# Patient Record
Sex: Male | Born: 1995 | Race: White | Hispanic: No | Marital: Single | Smoking: Never smoker
Health system: Southern US, Community
[De-identification: ages and names within clinical notes are randomized; demographics above are authoritative.]

## PROBLEM LIST (undated history)

## (undated) DIAGNOSIS — R51 Headache: Secondary | ICD-10-CM

## (undated) DIAGNOSIS — R519 Headache, unspecified: Secondary | ICD-10-CM

## (undated) DIAGNOSIS — J45909 Unspecified asthma, uncomplicated: Secondary | ICD-10-CM

## (undated) HISTORY — PX: FINGER SURGERY: SHX640

---
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
## 2017-12-01 ENCOUNTER — Other Ambulatory Visit: Payer: Self-pay | Admitting: Orthopedic Surgery

## 2017-12-01 DIAGNOSIS — M2391 Unspecified internal derangement of right knee: Secondary | ICD-10-CM

## 2017-12-01 DIAGNOSIS — S8391XA Sprain of unspecified site of right knee, initial encounter: Secondary | ICD-10-CM

## 2017-12-10 ENCOUNTER — Ambulatory Visit
Admission: RE | Admit: 2017-12-10 | Discharge: 2017-12-10 | Disposition: A | Discharge status: Home / Self Care | Payer: Commercial Managed Care - HMO | Source: Ambulatory Visit | Attending: Orthopedic Surgery | Admitting: Orthopedic Surgery

## 2017-12-10 DIAGNOSIS — S83241A Other tear of medial meniscus, current injury, right knee, initial encounter: Secondary | ICD-10-CM | POA: Insufficient documentation

## 2017-12-10 DIAGNOSIS — M2391 Unspecified internal derangement of right knee: Secondary | ICD-10-CM

## 2017-12-10 DIAGNOSIS — R600 Localized edema: Secondary | ICD-10-CM | POA: Insufficient documentation

## 2017-12-10 DIAGNOSIS — S8391XA Sprain of unspecified site of right knee, initial encounter: Secondary | ICD-10-CM | POA: Diagnosis present

## 2017-12-10 DIAGNOSIS — X58XXXA Exposure to other specified factors, initial encounter: Secondary | ICD-10-CM | POA: Diagnosis not present

## 2017-12-20 ENCOUNTER — Other Ambulatory Visit: Payer: Self-pay

## 2017-12-20 ENCOUNTER — Encounter: Payer: Self-pay | Admitting: *Deleted

## 2017-12-23 ENCOUNTER — Ambulatory Visit: Payer: Commercial Managed Care - HMO | Admitting: Anesthesiology

## 2017-12-23 ENCOUNTER — Ambulatory Visit
Admission: RE | Admit: 2017-12-23 | Discharge: 2017-12-23 | Disposition: A | Discharge status: Home / Self Care | Payer: Commercial Managed Care - HMO | Source: Ambulatory Visit | Attending: Orthopedic Surgery | Admitting: Orthopedic Surgery

## 2017-12-23 ENCOUNTER — Encounter
Admission: RE | Disposition: A | Discharge status: Home / Self Care | Payer: Self-pay | Source: Ambulatory Visit | Attending: Orthopedic Surgery

## 2017-12-23 DIAGNOSIS — Y9323 Activity, snow (alpine) (downhill) skiing, snow boarding, sledding, tobogganing and snow tubing: Secondary | ICD-10-CM | POA: Insufficient documentation

## 2017-12-23 DIAGNOSIS — S83241A Other tear of medial meniscus, current injury, right knee, initial encounter: Secondary | ICD-10-CM | POA: Insufficient documentation

## 2017-12-23 DIAGNOSIS — X58XXXA Exposure to other specified factors, initial encounter: Secondary | ICD-10-CM | POA: Insufficient documentation

## 2017-12-23 DIAGNOSIS — W000XXA Fall on same level due to ice and snow, initial encounter: Secondary | ICD-10-CM | POA: Diagnosis not present

## 2017-12-23 DIAGNOSIS — M794 Hypertrophy of (infrapatellar) fat pad: Secondary | ICD-10-CM | POA: Diagnosis not present

## 2017-12-23 HISTORY — DX: Unspecified asthma, uncomplicated: J45.909

## 2017-12-23 HISTORY — DX: Headache, unspecified: R51.9

## 2017-12-23 HISTORY — PX: KNEE ARTHROSCOPY WITH MEDIAL MENISECTOMY: SHX5651

## 2017-12-23 HISTORY — DX: Headache: R51

## 2017-12-23 SURGERY — ARTHROSCOPY, KNEE, WITH MEDIAL MENISCECTOMY
Anesthesia: General | Site: Knee | Laterality: Right | Wound class: Clean

## 2017-12-23 MED ORDER — ACETAMINOPHEN 10 MG/ML IV SOLN
1000.0000 mg | Freq: Once | INTRAVENOUS | Status: DC | PRN
  Administered 2017-12-23: 1000 mg via INTRAVENOUS

## 2017-12-23 MED ORDER — CEFAZOLIN SODIUM-DEXTROSE 2-4 GM/100ML-% IV SOLN
2.0000 g | Freq: Once | INTRAVENOUS | Status: AC
  Administered 2017-12-23: 2 g via INTRAVENOUS

## 2017-12-23 MED ORDER — OXYCODONE HCL 5 MG PO TABS: 5.0000 mg | ORAL_TABLET | ORAL | 0 refills | Status: AC | PRN

## 2017-12-23 MED ORDER — IBUPROFEN 800 MG PO TABS: 800.0000 mg | ORAL_TABLET | Freq: Three times a day (TID) | ORAL | 0 refills | Status: AC

## 2017-12-23 MED ORDER — GLYCOPYRROLATE 0.2 MG/ML IJ SOLN
INTRAMUSCULAR | Status: DC | PRN
  Administered 2017-12-23: 0.1 mg via INTRAVENOUS

## 2017-12-23 MED ORDER — ACETAMINOPHEN 500 MG PO TABS: 1000.0000 mg | ORAL_TABLET | Freq: Three times a day (TID) | ORAL | 2 refills | Status: AC

## 2017-12-23 MED ORDER — ONDANSETRON HCL 4 MG/2ML IJ SOLN
INTRAMUSCULAR | Status: DC | PRN
  Administered 2017-12-23: 4 mg via INTRAVENOUS

## 2017-12-23 MED ORDER — ROPIVACAINE HCL 5 MG/ML IJ SOLN
INTRAMUSCULAR | Status: DC | PRN
Start: 2017-12-23 — End: 2017-12-23
  Administered 2017-12-23: 20 mL

## 2017-12-23 MED ORDER — BUPIVACAINE HCL 0.5 % IJ SOLN
INTRAMUSCULAR | Status: DC | PRN
  Administered 2017-12-23: 1.5 mL

## 2017-12-23 MED ORDER — PROPOFOL 10 MG/ML IV BOLUS
INTRAVENOUS | Status: DC | PRN
  Administered 2017-12-23: 160 mg via INTRAVENOUS

## 2017-12-23 MED ORDER — FENTANYL CITRATE (PF) 100 MCG/2ML IJ SOLN
25.0000 ug | INTRAMUSCULAR | Status: DC | PRN
  Administered 2017-12-23: 25 ug via INTRAVENOUS
  Administered 2017-12-23 (×2): 12.5 ug via INTRAVENOUS

## 2017-12-23 MED ORDER — LACTATED RINGERS IV SOLN
INTRAVENOUS | Status: DC
  Administered 2017-12-23: 12:00:00 via INTRAVENOUS

## 2017-12-23 MED ORDER — FENTANYL CITRATE (PF) 100 MCG/2ML IJ SOLN
INTRAMUSCULAR | Status: DC | PRN
  Administered 2017-12-23: 100 ug via INTRAVENOUS
  Administered 2017-12-23: 50 ug via INTRAVENOUS

## 2017-12-23 MED ORDER — DEXAMETHASONE SODIUM PHOSPHATE 4 MG/ML IJ SOLN
INTRAMUSCULAR | Status: DC | PRN
  Administered 2017-12-23: 6 mg via INTRAVENOUS

## 2017-12-23 MED ORDER — MIDAZOLAM HCL 2 MG/2ML IJ SOLN
INTRAMUSCULAR | Status: DC | PRN
Start: 2017-12-23 — End: 2017-12-23
  Administered 2017-12-23 (×2): 2 mg via INTRAVENOUS

## 2017-12-23 MED ORDER — OXYCODONE HCL 5 MG PO TABS
5.0000 mg | ORAL_TABLET | Freq: Once | ORAL | Status: AC | PRN
  Administered 2017-12-23: 5 mg via ORAL

## 2017-12-23 MED ORDER — OXYCODONE HCL 5 MG/5ML PO SOLN: 5.0000 mg | Freq: Once | ORAL | Status: AC | PRN

## 2017-12-23 MED ORDER — ONDANSETRON 4 MG PO TBDP: 4.0000 mg | ORAL_TABLET | Freq: Three times a day (TID) | ORAL | 0 refills | Status: AC | PRN

## 2017-12-23 MED ORDER — LIDOCAINE-EPINEPHRINE 1 %-1:100000 IJ SOLN
INTRAMUSCULAR | Status: DC | PRN
  Administered 2017-12-23: 1.5 mL

## 2017-12-23 MED ORDER — ONDANSETRON HCL 4 MG/2ML IJ SOLN: 4.0000 mg | Freq: Once | INTRAMUSCULAR | Status: DC | PRN

## 2017-12-23 MED ORDER — LIDOCAINE HCL (CARDIAC) 20 MG/ML IV SOLN
INTRAVENOUS | Status: DC | PRN
  Administered 2017-12-23: 50 mg via INTRATRACHEAL

## 2017-12-23 MED ORDER — LACTATED RINGERS IV SOLN: INTRAVENOUS | Status: DC

## 2017-12-23 MED ORDER — ASPIRIN EC 325 MG PO TBEC: 325.0000 mg | DELAYED_RELEASE_TABLET | Freq: Every day | ORAL | 0 refills | Status: AC

## 2017-12-23 SURGICAL SUPPLY — 39 items
ADAPTER IRRIG TUBE 2 SPIKE SOL (ADAPTER) ×6 IMPLANT
BLADE SURG 15 STRL LF DISP TIS (BLADE) ×1 IMPLANT
BLADE SURG 15 STRL SS (BLADE) ×2
BLADE SURG SZ11 CARB STEEL (BLADE) ×3 IMPLANT
BNDG COHESIVE 4X5 TAN STRL (GAUZE/BANDAGES/DRESSINGS) ×3 IMPLANT
BNDG ESMARK 6X12 TAN STRL LF (GAUZE/BANDAGES/DRESSINGS) ×3 IMPLANT
BRACE KNEE POST OP SHORT (BRACE) ×3 IMPLANT
BUR RADIUS 3.5 (BURR) ×3 IMPLANT
BUR RADIUS 4.0X18.5 (BURR) ×3 IMPLANT
CHLORAPREP W/TINT 26ML (MISCELLANEOUS) ×3 IMPLANT
CLEANER CAUTERY TIP 5X5 PAD (MISCELLANEOUS) ×1 IMPLANT
COOLER POLAR GLACIER W/PUMP (MISCELLANEOUS) ×3 IMPLANT
COVER LIGHT HANDLE UNIVERSAL (MISCELLANEOUS) ×6 IMPLANT
CUFF TOURN SGL QUICK 30 (MISCELLANEOUS) ×2
CUFF TRNQT CYL LO 30X4X (MISCELLANEOUS) ×1 IMPLANT
CUTTER SUT KNOT PUSHER AIR (CUTTER) ×3 IMPLANT
DEVICE MENISCAL CVD UP (Anchor) ×9 IMPLANT
DRAPE IMP U-DRAPE 54X76 (DRAPES) ×3 IMPLANT
GAUZE SPONGE 4X4 12PLY STRL (GAUZE/BANDAGES/DRESSINGS) ×3 IMPLANT
GLOVE BIO SURGEON STRL SZ7.5 (GLOVE) ×3 IMPLANT
GLOVE BIOGEL PI IND STRL 8 (GLOVE) ×1 IMPLANT
GLOVE BIOGEL PI INDICATOR 8 (GLOVE) ×2
GOWN STRL REUS W/ TWL LRG LVL3 (GOWN DISPOSABLE) ×1 IMPLANT
GOWN STRL REUS W/TWL LRG LVL3 (GOWN DISPOSABLE) ×5 IMPLANT
IV LACTATED RINGER IRRG 3000ML (IV SOLUTION) ×20
IV LR IRRIG 3000ML ARTHROMATIC (IV SOLUTION) ×10 IMPLANT
KIT TURNOVER KIT A (KITS) ×3 IMPLANT
MAT BLUE FLOOR 46X72 FLO (MISCELLANEOUS) ×3 IMPLANT
NEPTUNE MANIFOLD (MISCELLANEOUS) ×3 IMPLANT
PACK ARTHROSCOPY KNEE (MISCELLANEOUS) ×3 IMPLANT
PAD CLEANER CAUTERY TIP 5X5 (MISCELLANEOUS) ×2
PAD WRAPON POLAR KNEE (MISCELLANEOUS) ×1 IMPLANT
SET TUBE SUCT SHAVER OUTFL 24K (TUBING) ×3 IMPLANT
SET TUBE TIP INTRA-ARTICULAR (MISCELLANEOUS) ×3 IMPLANT
SUT ETHILON 3-0 FS-10 30 BLK (SUTURE) ×3
SUTURE EHLN 3-0 FS-10 30 BLK (SUTURE) ×1 IMPLANT
TOWEL OR 17X26 4PK STRL BLUE (TOWEL DISPOSABLE) ×6 IMPLANT
TUBING ARTHRO INFLOW-ONLY STRL (TUBING) ×3 IMPLANT
WRAPON POLAR PAD KNEE (MISCELLANEOUS) ×3

## 2017-12-23 NOTE — Anesthesia Procedure Notes (Signed)
Procedure Name: LMA Insertion Date/Time: 12/23/2017 12:24 PM Performed by: Jimmy PicketAmyot, Antonios Ostrow, CRNA Pre-anesthesia Checklist: Patient identified, Emergency Drugs available, Suction available, Timeout performed and Patient being monitored Patient Re-evaluated:Patient Re-evaluated prior to induction Oxygen Delivery Method: Circle system utilized Preoxygenation: Pre-oxygenation with 100% oxygen Induction Type: IV induction LMA: LMA inserted LMA Size: 4.0 Number of attempts: 1 Placement Confirmation: positive ETCO2 and breath sounds checked- equal and bilateral Tube secured with: Tape

## 2017-12-23 NOTE — Transfer of Care (Signed)
Immediate Anesthesia Transfer of Care Note  Patient: Stephen Payne  Procedure(s) Performed: KNEE ARTHROSCOPY WITH MEDIAL MENISECTOMY (Right Knee)  Patient Location: PACU  Anesthesia Type: General  Level of Consciousness: awake, alert  and patient cooperative  Airway and Oxygen Therapy: Patient Spontanous Breathing and Patient connected to supplemental oxygen  Post-op Assessment: Post-op Vital signs reviewed, Patient's Cardiovascular Status Stable, Respiratory Function Stable, Patent Airway and No signs of Nausea or vomiting  Post-op Vital Signs: Reviewed and stable  Complications: No apparent anesthesia complications

## 2017-12-23 NOTE — Discharge Instructions (Signed)
Arthroscopic Knee Surgery - Meniscus Repair  Post-Op Instructions  1. Bracing or crutches: Crutches will be provided at the time of discharge from the surgery center.   2. Ice: You may be provided with a device [facility]) that allows you to ice the affected area effectively. Otherwise you can ice manually.   3. Driving:  Plan on not driving for at least four weeks. Please note that you are advised NOT to drive while taking narcotic pain medications as you may be impaired and unsafe to drive.  4. Activity: Ankle pumps several times an hour while awake to prevent blood clots. Weight bearing: 50%WB on operative extremity. Use crutches for at least 4 weeks, if not 6 based on your surgery. Bending and straightening the knee is unlimited, but do not flex your knee past 90 degrees until cleared by your therapist. Elevate knee above heart level as much as possible for one week. Avoid standing more than 5 minutes (consecutively) for the first week. No exercise involving the knee until cleared by the surgeon or physical therapist.  Avoid long distance travel for 4 weeks.  5. Medications:  - You have been provided a prescription for narcotic pain medicine. After surgery, take 1-2 narcotic tablets every 4 hours if needed for severe pain.  - A prescription for anti-nausea medication will be provided in case the narcotic medicine causes nausea - take 1 tablet every 6 hours only if nauseated.  - Take ibuprofen 800 mg every 8 hours with food to reduce post-operative knee swelling. DO NOT STOP IBUPROFEN POST-OP UNTIL INSTRUCTED TO DO SO at first post-op office visit (10-14 days after surgery).  - Take enteric coated aspirin 325 mg once daily for 2 weeks to prevent blood clots.  -Take tylenol 1000 every 8 hours for pain.  May stop tylenol 3 days after surgery or when you are having minimal pain.  If you are taking prescription medication for anxiety, depression, insomnia, muscle spasm, chronic pain, or for  attention deficit disorder you are advised that you are at a higher risk of adverse effects with use of narcotics post-op, including narcotic addiction/dependence, depressed breathing, death. If you use non-prescribed substances: alcohol, marijuana, cocaine, heroin, methamphetamines, etc., you are at a higher risk of adverse effects with use of narcotics post-op, including narcotic addiction/dependence, depressed breathing, death. You are advised that taking > 50 morphine milligram equivalents (MME) of narcotic pain medication per day results in twice the risk of overdose or death. For your prescription provided: oxycodone 5 mg - taking more than 6 tablets per day. Be advised that we will prescribe narcotics short-term, for acute post-operative pain only - 1 week for minor operations such as knee arthroscopy for meniscus tear resection, and 3 weeks for major operations such as knee repair/reconstruction surgeries.   6. Bandages: The physical therapist should change the bandages at the first post-op appointment. If needed, the dressing supplies have been provided to you.  7. Physical Therapy: 2 times per week for the first 4 weeks, then 1-2 times per week from weeks 4-8 post-op. Therapy typically starts on post operative Day 3 or 4. You have been provided an order for physical therapy. The therapist will provide home exercises.  8. Work: May return to full work when off of crutches. May do light duty/desk job in approximately 1-2 weeks when off of narcotics, pain is well-controlled, and swelling has decreased.  9. Post-Op Appointments: Your first post-op appointment will be with Dr. Allena Katz in approximately 2 weeks  time.   If you find that they have not been scheduled please call the Orthopaedic Appointment front desk at [phone].     General Anesthesia, Adult, Care After These instructions provide you with information about caring for yourself after your procedure. Your health care provider  may also give you more specific instructions. Your treatment has been planned according to current medical practices, but problems sometimes occur. Call your health care provider if you have any problems or questions after your procedure. What can I expect after the procedure? After the procedure, it is common to have:  Vomiting.  A sore throat.  Mental slowness.  It is common to feel:  Nauseous.  Cold or shivery.  Sleepy.  Tired.  Sore or achy, even in parts of your body where you did not have surgery.  Follow these instructions at home: For at least 24 hours after the procedure:  Do not: ? Participate in activities where you could fall or become injured. ? Drive. ? Use heavy machinery. ? Drink alcohol. ? Take sleeping pills or medicines that cause drowsiness. ? Make important decisions or sign legal documents. ? Take care of children on your own.  Rest. Eating and drinking  If you vomit, drink water, juice, or soup when you can drink without vomiting.  Drink enough fluid to keep your urine clear or pale yellow.  Make sure you have little or no nausea before eating solid foods.  Follow the diet recommended by your health care provider. General instructions  Have a responsible adult stay with you until you are awake and alert.  Return to your normal activities as told by your health care provider. Ask your health care provider what activities are safe for you.  Take over-the-counter and prescription medicines only as told by your health care provider.  If you smoke, do not smoke without supervision.  Keep all follow-up visits as told by your health care provider. This is important. Contact a health care provider if:  You continue to have nausea or vomiting at home, and medicines are not helpful.  You cannot drink fluids or start eating again.  You cannot urinate after 8-12 hours.  You develop a skin rash.  You have fever.  You have increasing redness  at the site of your procedure. Get help right away if:  You have difficulty breathing.  You have chest pain.  You have unexpected bleeding.  You feel that you are having a life-threatening or urgent problem. This information is not intended to replace advice given to you by your health care provider. Make sure you discuss any questions you have with your health care provider. Document Released: 01/11/2001 Document Revised: 03/09/2016 Document Reviewed: 09/19/2015 Elsevier Interactive Patient Education  [employer]2018 [employer]

## 2017-12-23 NOTE — Anesthesia Preprocedure Evaluation (Signed)
Anesthesia Evaluation  Patient identified by MRN, date of birth, ID band Patient awake    Reviewed: Allergy & Precautions, NPO status , Patient's Chart, lab work & pertinent test results  History of Anesthesia Complications Negative for: history of anesthetic complications  Airway Mallampati: I  TM Distance: >3 FB Neck ROM: Full    Dental  (+)    Pulmonary asthma (childhood) ,    Pulmonary exam normal breath sounds clear to auscultation       Cardiovascular Exercise Tolerance: Good negative cardio ROS Normal cardiovascular exam Rhythm:Regular Rate:Normal     Neuro/Psych  Headaches,    GI/Hepatic negative GI ROS,   Endo/Other  negative endocrine ROS  Renal/GU negative Renal ROS     Musculoskeletal   Abdominal   Peds  Hematology negative hematology ROS (+)   Anesthesia Other Findings   Reproductive/Obstetrics                             Anesthesia Physical Anesthesia Plan  ASA: II  Anesthesia Plan: General   Post-op Pain Management:  Regional for Post-op pain and GA combined w/ Regional for post-op pain   Induction: Intravenous  PONV Risk Score and Plan: 2 and Ondansetron and Dexamethasone  Airway Management Planned: LMA  Additional Equipment:   Intra-op Plan:   Post-operative Plan: Extubation in OR  Informed Consent: I have reviewed the patients History and Physical, chart, labs and discussed the procedure including the risks, benefits and alternatives for the proposed anesthesia with the patient or authorized representative who has indicated his/her understanding and acceptance.     Plan Discussed with: CRNA  Anesthesia Plan Comments:         Anesthesia Quick Evaluation

## 2017-12-23 NOTE — Anesthesia Postprocedure Evaluation (Signed)
Anesthesia Post Note  Patient: Stephen Payne  Procedure(s) Performed: KNEE ARTHROSCOPY WITH MEDIAL MENISECTOMY (Right Knee)  Patient location during evaluation: PACU Anesthesia Type: General Level of consciousness: awake and alert, oriented and patient cooperative Pain management: pain level controlled Vital Signs Assessment: post-procedure vital signs reviewed and stable Respiratory status: spontaneous breathing, nonlabored ventilation and respiratory function stable Cardiovascular status: blood pressure returned to baseline and stable Postop Assessment: adequate PO intake Anesthetic complications: no    Reed BreechAndrea Airel Magadan

## 2017-12-23 NOTE — Op Note (Addendum)
Operative Note   SURGERY DATE: 12/23/2017  PRE-OP DIAGNOSIS:  1. Right medial meniscus tear  POST-OP DIAGNOSIS: 1. Right medial meniscus tear  PROCEDURES:  1. Right knee arthroscopy, medial meniscus repair 2. Chondroplasty and microfracture of intercondylar notch  SURGEON: Rosealee Albee, MD  ANESTHESIA: Gen  ESTIMATED BLOOD LOSS:minimal  TOTAL IV FLUIDS: per anesthesia  INDICATION(S): VAIBHAV Payne is a 22 y.o. male who slipped on a patch of ice while snowboarding and had immediate knee pain. Clinical exam and MRI were consistent with a medial meniscus tear. After discussion of risks, benefits, and alternatives to surgery, the patient elected to proceed.  The patient understands that there is a higher risk of re-tear with meniscus repair, but would prefer repair to maintain the normal biomechanics and structure of the knee. He is willing to perform the appropriate rehab and maintain weight-bearing restrictions post-operatively.  OPERATIVE FINDINGS:   Examination under anesthesia:A careful examination under anesthesia was performed. Passive range of motion was: Hyperextension: 2. Extension: 0. Flexion: 140. Lachman: normal. Pivot Shift: normal. Posterior drawer: normal. Varus stability in full extension: normal. Varus stability in 30 degrees of flexion: normal. Valgus stability in full extension: normal. Valgus stability in 30 degrees of flexion: normal.  Intra-operative findings:A thorough arthroscopic examination of the knee was performed. The findings are: 1. Suprapatellar pouch: Normal 2. Undersurface of median ridge: Normal 3. Medial patellar facet: Normal 4. Lateral patellar facet: Normal 5. Trochlea: Normal 6. Lateral gutter/popliteus tendon: Normal 7. Hoffa's fat pad: Inflamed 8. Medial gutter/plica: Normal 9. ACL: Normal 10. PCL: Normal 11. Medial meniscus: Small vertical tear of the posterior horn near the posterior aspect of the meniscus  near the capsule 12. Medial compartment cartilage: Normal 13. Lateral meniscus: Normal 14. Lateral compartment cartilage: Normal  OPERATIVE REPORT:   I identified Jasiri A Worleyin the pre-operative holding area. I marked theoperativeknee with my initials. I reviewed the risks and benefits of the proposed surgical intervention, and the patient (and/or patient's guardian) wished to proceed. The patient was transferred to the operative suite and placed in the supine position with all bony prominences padded. Anesthesia was administered. Appropriate IV antibioticswere administered within 30 minutes of incision. The extremity was then prepped and draped in standard fashion. A time out was performed confirming the correct extremity, correct patient, and correct procedure.  Arthroscopy portals were marked. Local anesthetic was injected to the planned portal sites. The anterolateral portalwasestablished with an 11blade. The arthroscope was placed in the anterolateral portal and theninto the suprapatellar pouch. Next the medial portal was established under needle localization. A spinal needle was used to pie-crust the MCL to allow for better visualization and protect the cartilage surfaces during instrumentation. The medial meniscus tear was identified. A diagnostic knee scope was completed with the above findings.   The edges of the meniscus tear and capsule were roughened with a rasp and shaver to create a more optimal healing surface.  [employer]ker AIR all-inside device x 3 were used to reduce the torn meniscus to the stable rim and capsule. The meniscus was probed and felt to be stable. Cartilage and soft tissue was removed from the lateral wall and roof of the intercondylar notch. Microfracture of the intercondylar notch was then performed to allow for improved meniscus healing. Tourniquet was released with time of 47 minutes. Arthroscopic fluid was removed from the joint.  The portals were  closed with 3-0 Nylon suture. Sterile dressings included Xeroform, 4x4s, Sof-Rol, and Bias wrap. A Polarcare was placed. A  T-scope hinged knee brace was applied.  The patient was then awakened and taken to the PACU hemodynamically stable without complication.   POSTOPERATIVE PLAN: The patient will be discharged home today once they meet PACU criteria. Aspirin 325 mg daily was prescribed for 2 weeks for DVT prophylaxis. Physical therapy will start on POD#3-4.50%WB x 4 weeks. F/U in 2 weeks.

## 2017-12-23 NOTE — H&P (Signed)
Paper H&P to be scanned into permanent record. H&P reviewed. No significant changes noted.  

## 2017-12-23 NOTE — Anesthesia Procedure Notes (Signed)
Anesthesia Regional Block: Adductor canal block   Pre-Anesthetic Checklist: ,, timeout performed, Correct Patient, Correct Site, Correct Laterality, Correct Procedure, Correct Position, site marked, Risks and benefits discussed,  Surgical consent,  Pre-op evaluation,  At surgeon's request and post-op pain management  Laterality: Right  Prep: chloraprep       Needles:  Injection technique: Single-shot  Needle Type: Echogenic Needle     Needle Length: 9cm  Needle Gauge: 21     Additional Needles:   Procedures:,,,, ultrasound used (permanent image in chart),,,,  Narrative:  Start time: 12/23/2017 11:04 AM End time: 12/23/2017 11:11 AM Injection made incrementally with aspirations every 5 mL.  Performed by: Personally  Anesthesiologist: Reed BreechMazzoni, Lakysha Kossman, MD  Additional Notes: Functioning IV was confirmed and monitors applied. Ultrasound guidance: relevant anatomy identified, needle position confirmed, local anesthetic spread visualized around nerve(s)., vascular puncture avoided.  Image printed for medical record.  Negative aspiration and no paresthesias; incremental administration of local anesthetic. The patient tolerated the procedure well. Vitals signes recorded in RN notes.

## 2018-07-14 IMAGING — MR MR KNEE*R* W/O CM
6 series · 40 of 40 positions shown · non-contrast
Comparison: None.

CLINICAL DATA: Fell on ice [REDACTED]. Pain medial. Swelling. Bending
makes pain worse. Unstable. No surgery

EXAM:
MRI OF THE RIGHT KNEE WITHOUT CONTRAST
TECHNIQUE: Multiplanar, multisequence MR imaging of the knee was performed. No
intravenous contrast was administered.

[Series 3: PD fat-sat · axial · 3.0mm · 0.35mm/px · z∈[-51,+65]mm · 7 of 36 slices shown (1 of 4)]
[im 1/36]
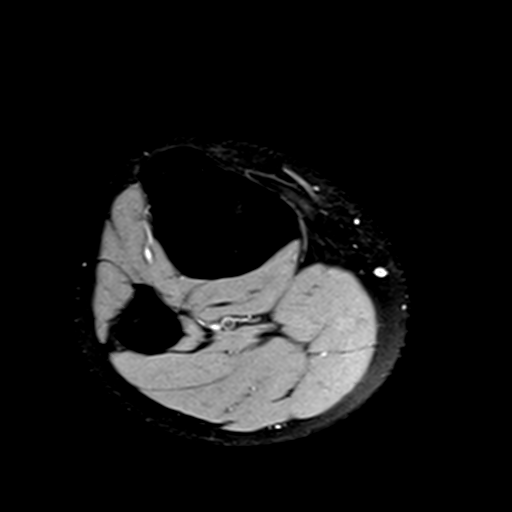
[im 6/36]
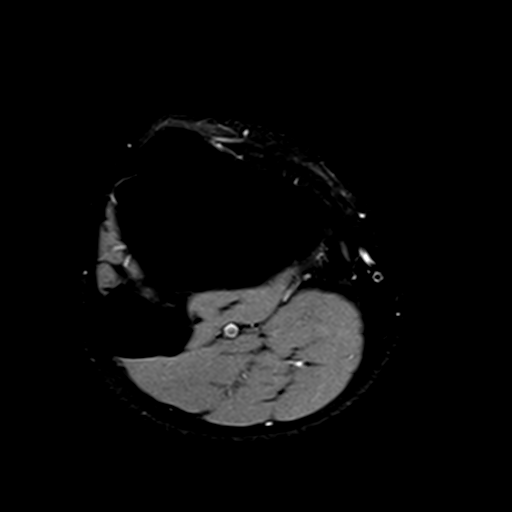
[im 12/36]
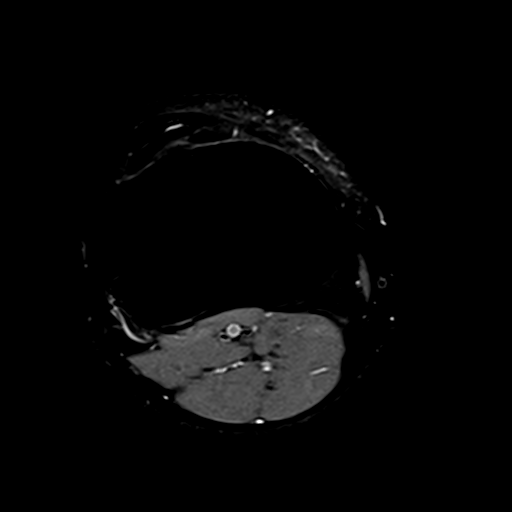
[im 18/36]
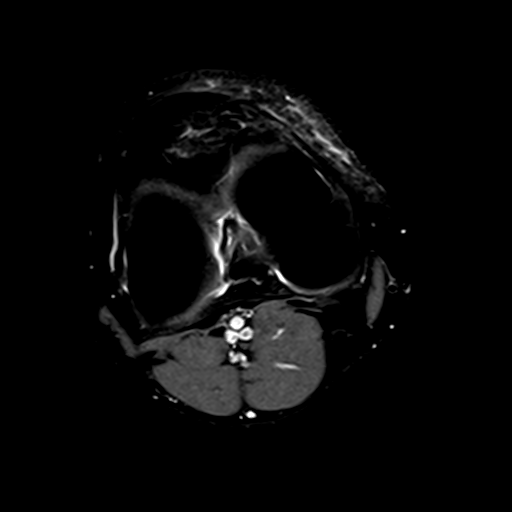
[im 24/36]
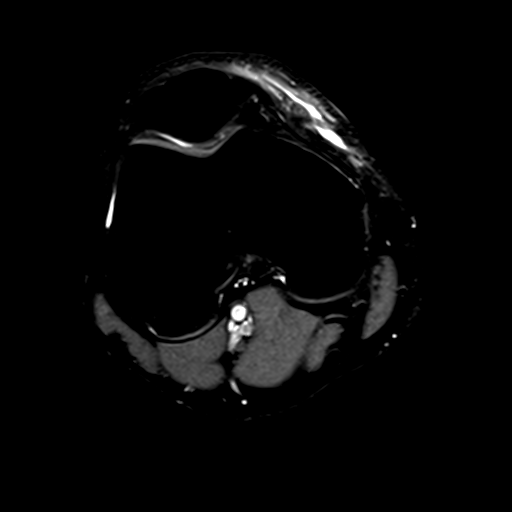
[im 30/36]
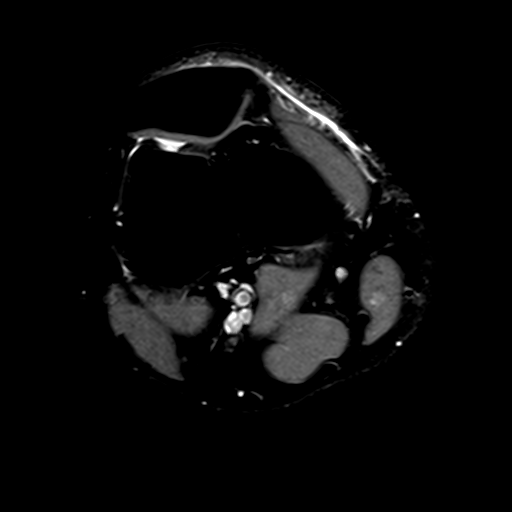
[im 36/36]
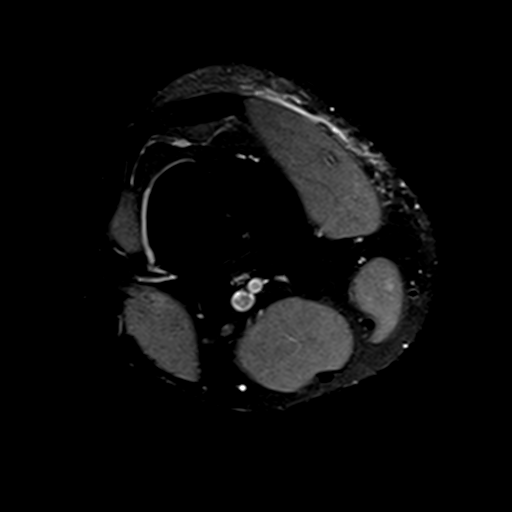

[Series 4: T1 · coronal · 3.0mm · 0.50mm/px · 7 of 34 slices shown]
[im 1/34]
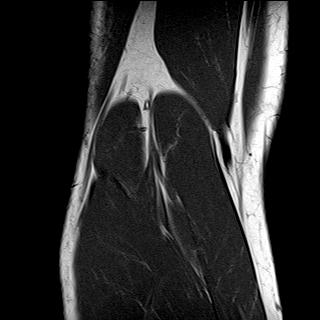
[im 6/34]
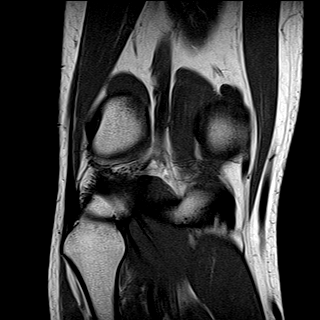
[im 12/34]
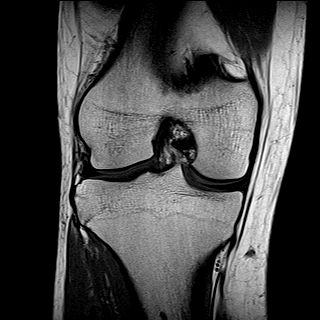
[im 17/34]
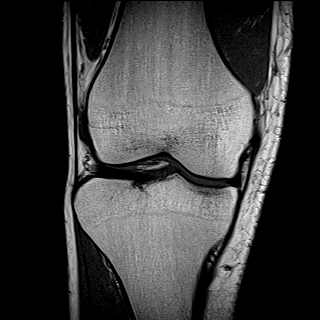
[im 23/34]
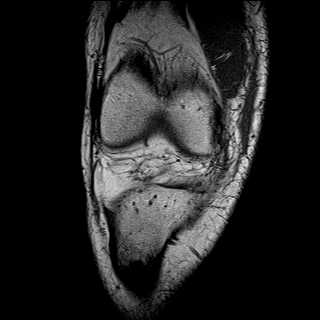
[im 28/34]
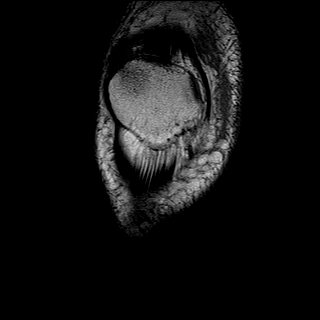
[im 34/34]
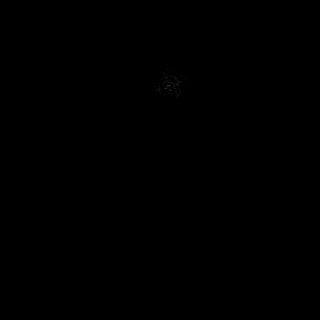

[Series 5: T2 fat-sat · coronal · 3.0mm · 0.50mm/px · 7 of 34 slices shown]
[im 1/34]
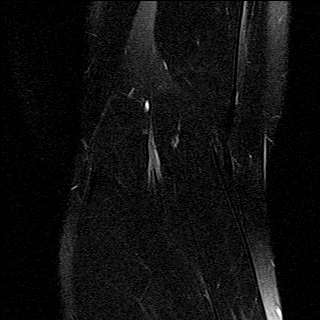
[im 6/34]
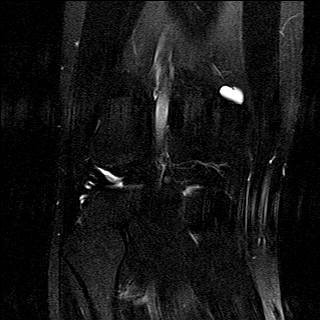
[im 12/34]
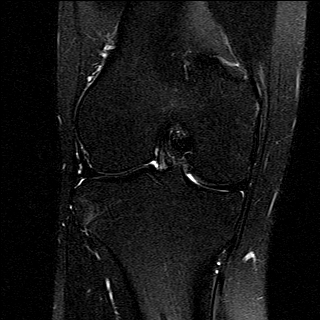
[im 17/34]
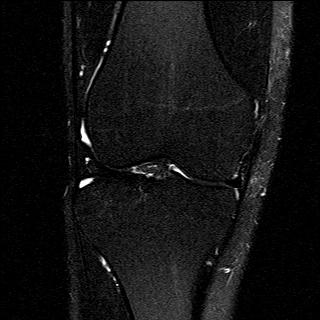
[im 23/34]
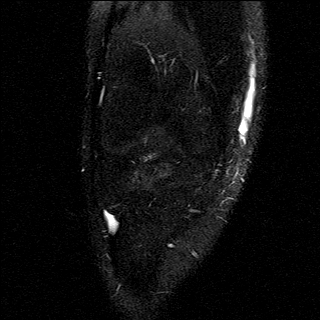
[im 28/34]
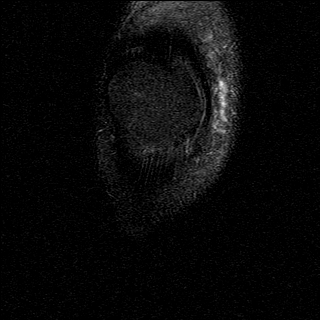
[im 34/34]
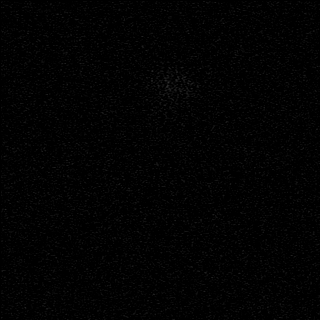

[Series 6: PD fat-sat · coronal · 3.0mm · 0.62mm/px · 7 of 34 slices shown (2 of 4)]
[im 1/34]
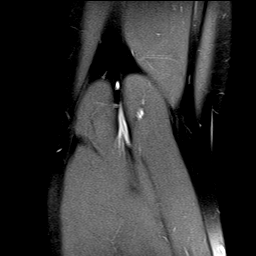
[im 6/34]
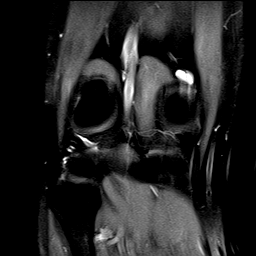
[im 12/34]
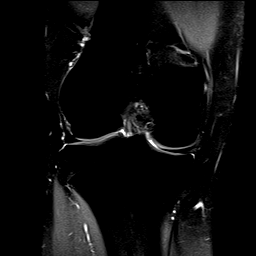
[im 17/34]
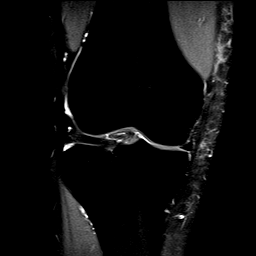
[im 23/34]
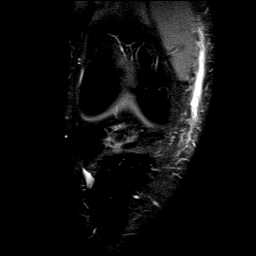
[im 28/34]
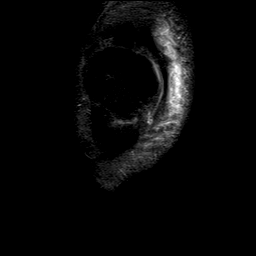
[im 34/34]
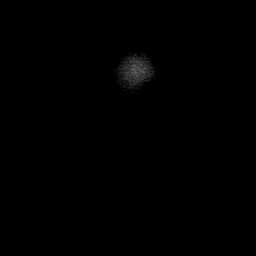

[Series 7: PD fat-sat · sagittal · 3.0mm · 0.62mm/px · 8 of 37 slices shown (3 of 4)]
[im 1/37]
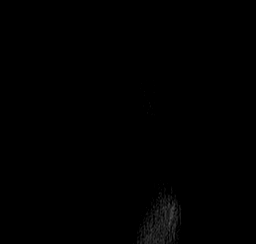
[im 6/37]
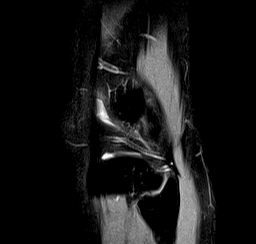
[im 11/37]
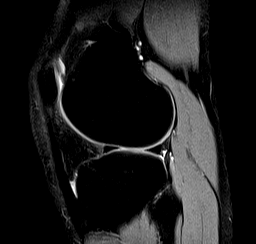
[im 16/37]
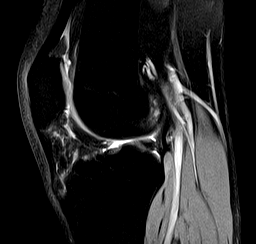
[im 21/37]
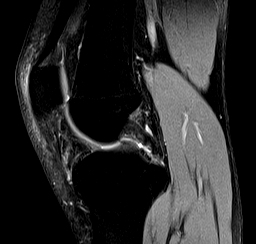
[im 26/37]
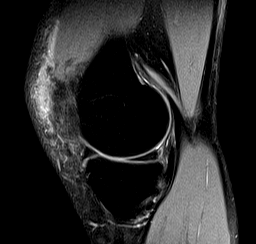
[im 31/37]
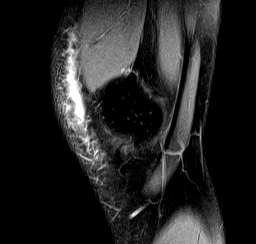
[im 37/37]
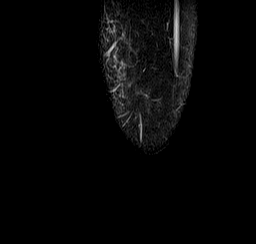

[Series 8: PD fat-sat · coronal · 2.0mm · 0.62mm/px · 4 of 19 slices shown (4 of 4)]
[im 1/19]
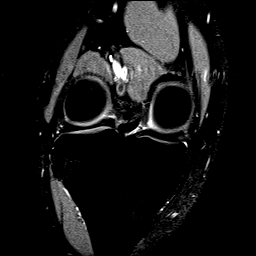
[im 7/19]
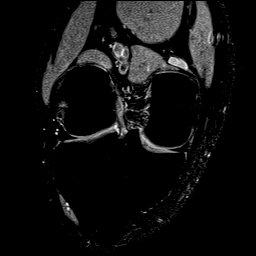
[im 13/19]
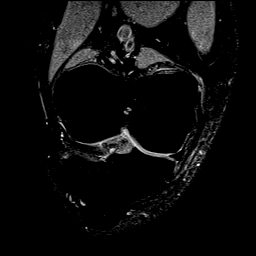
[im 19/19]
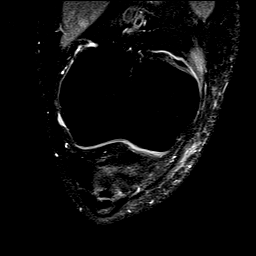

[40 of 40 positions shown; findings below may reference images not displayed]

FINDINGS: MENISCI

Medial meniscus: Small tear along the periphery of the posterior
horn of the medial meniscus involving the superior surface.

Lateral meniscus:  Intact.

LIGAMENTS

Cruciates:  Intact ACL and PCL.

Collaterals: Medial collateral ligament is intact. Lateral
collateral ligament complex is intact.

CARTILAGE

Patellofemoral:  No chondral defect.

Medial:  No chondral defect.

Lateral:  No chondral defect.

Joint: No joint effusion. Mild edema in Hoffa's fat. No plical
thickening.

Popliteal Fossa:  No Baker cyst. Intact popliteus tendon.

Extensor Mechanism: Intact quadriceps tendon. Intact patellar tendon
with enthesopathic changes at the patellar tendon insertion. Intact
medial patellar retinaculum. Intact lateral patellar retinaculum.
Intact MPFL.

Bones:  No acute osseous abnormality.  No aggressive osseous lesion.

Other: Muscles are normal. Small ganglion cyst along the superficial
aspect of the medial gastrocnemius tendon origin.
IMPRESSION: 1. Small tear along the periphery of the posterior horn of the
medial meniscus involving the superior surface.
2. Mild edema in Hoffa's fat.

## 2019-11-14 ENCOUNTER — Ambulatory Visit: Payer: Medicaid Other | Attending: Internal Medicine

## 2019-11-14 DIAGNOSIS — Z20822 Contact with and (suspected) exposure to covid-19: Secondary | ICD-10-CM

## 2019-11-15 LAB — NOVEL CORONAVIRUS, NAA: SARS-CoV-2, NAA: NOT DETECTED
